# Patient Record
Sex: Male | Born: 1985 | Race: White | Hispanic: No | Marital: Married | Smoking: Never smoker
Health system: Southern US, Community
[De-identification: ages and names within clinical notes are randomized; demographics above are authoritative.]

## PROBLEM LIST (undated history)

## (undated) DIAGNOSIS — J45909 Unspecified asthma, uncomplicated: Secondary | ICD-10-CM

---

## 2011-09-08 ENCOUNTER — Ambulatory Visit (HOSPITAL_COMMUNITY)
Admission: RE | Admit: 2011-09-08 | Discharge: 2011-09-08 | Disposition: A | Discharge status: Home / Self Care | Payer: No Typology Code available for payment source | Source: Ambulatory Visit | Attending: Family Medicine | Admitting: Family Medicine

## 2011-09-08 ENCOUNTER — Other Ambulatory Visit (HOSPITAL_COMMUNITY): Payer: Self-pay | Admitting: Family Medicine

## 2011-09-08 ENCOUNTER — Other Ambulatory Visit (HOSPITAL_COMMUNITY): Payer: Self-pay | Admitting: Internal Medicine

## 2011-09-08 ENCOUNTER — Ambulatory Visit (HOSPITAL_COMMUNITY)
Admission: RE | Admit: 2011-09-08 | Discharge: 2011-09-08 | Disposition: A | Discharge status: Home / Self Care | Payer: No Typology Code available for payment source | Source: Ambulatory Visit | Attending: Internal Medicine | Admitting: Internal Medicine

## 2011-09-08 DIAGNOSIS — Z01818 Encounter for other preprocedural examination: Secondary | ICD-10-CM

## 2011-09-08 DIAGNOSIS — R51 Headache: Secondary | ICD-10-CM | POA: Insufficient documentation

## 2014-09-09 ENCOUNTER — Other Ambulatory Visit (HOSPITAL_COMMUNITY): Payer: Self-pay | Admitting: Physician Assistant

## 2014-09-09 DIAGNOSIS — R109 Unspecified abdominal pain: Secondary | ICD-10-CM

## 2014-09-11 ENCOUNTER — Ambulatory Visit (HOSPITAL_COMMUNITY)
Admission: RE | Admit: 2014-09-11 | Discharge: 2014-09-11 | Disposition: A | Discharge status: Home / Self Care | Payer: 59 | Source: Ambulatory Visit | Attending: Physician Assistant | Admitting: Physician Assistant

## 2014-09-11 DIAGNOSIS — R109 Unspecified abdominal pain: Secondary | ICD-10-CM | POA: Diagnosis not present

## 2016-10-11 DIAGNOSIS — Z23 Encounter for immunization: Secondary | ICD-10-CM | POA: Diagnosis not present

## 2016-11-30 DIAGNOSIS — Z1389 Encounter for screening for other disorder: Secondary | ICD-10-CM | POA: Diagnosis not present

## 2017-05-16 DIAGNOSIS — R1011 Right upper quadrant pain: Secondary | ICD-10-CM | POA: Diagnosis not present

## 2017-05-16 DIAGNOSIS — K829 Disease of gallbladder, unspecified: Secondary | ICD-10-CM | POA: Diagnosis not present

## 2017-05-16 DIAGNOSIS — Z1389 Encounter for screening for other disorder: Secondary | ICD-10-CM | POA: Diagnosis not present

## 2017-05-18 ENCOUNTER — Other Ambulatory Visit (HOSPITAL_COMMUNITY): Payer: Self-pay | Admitting: Family Medicine

## 2017-05-18 DIAGNOSIS — R1011 Right upper quadrant pain: Secondary | ICD-10-CM

## 2017-05-24 ENCOUNTER — Encounter (HOSPITAL_COMMUNITY): Payer: Self-pay

## 2017-05-24 ENCOUNTER — Encounter (HOSPITAL_COMMUNITY)
Admission: RE | Admit: 2017-05-24 | Discharge: 2017-05-24 | Disposition: A | Discharge status: Home / Self Care | Payer: 59 | Source: Ambulatory Visit | Attending: Family Medicine | Admitting: Family Medicine

## 2017-05-24 DIAGNOSIS — R1011 Right upper quadrant pain: Secondary | ICD-10-CM | POA: Diagnosis present

## 2017-05-24 HISTORY — DX: Unspecified asthma, uncomplicated: J45.909

## 2017-05-24 MED ORDER — TECHNETIUM TC 99M MEBROFENIN IV KIT
5.0000 | PACK | Freq: Once | INTRAVENOUS | Status: AC | PRN
  Administered 2017-05-24: 5 via INTRAVENOUS

## 2017-12-18 DIAGNOSIS — J069 Acute upper respiratory infection, unspecified: Secondary | ICD-10-CM | POA: Diagnosis not present

## 2018-05-24 ENCOUNTER — Ambulatory Visit (HOSPITAL_COMMUNITY)
Admission: RE | Admit: 2018-05-24 | Discharge: 2018-05-24 | Disposition: A | Discharge status: Home / Self Care | Payer: 59 | Source: Ambulatory Visit | Attending: Family Medicine | Admitting: Family Medicine

## 2018-05-24 ENCOUNTER — Other Ambulatory Visit (HOSPITAL_COMMUNITY): Payer: Self-pay | Admitting: Family Medicine

## 2018-05-24 DIAGNOSIS — S0011XA Contusion of right eyelid and periocular area, initial encounter: Secondary | ICD-10-CM

## 2018-05-24 DIAGNOSIS — R55 Syncope and collapse: Secondary | ICD-10-CM | POA: Diagnosis not present

## 2018-06-28 DIAGNOSIS — Z23 Encounter for immunization: Secondary | ICD-10-CM | POA: Diagnosis not present

## 2018-06-28 DIAGNOSIS — Z0000 Encounter for general adult medical examination without abnormal findings: Secondary | ICD-10-CM | POA: Diagnosis not present

## 2018-06-28 DIAGNOSIS — Z6838 Body mass index (BMI) 38.0-38.9, adult: Secondary | ICD-10-CM | POA: Diagnosis not present

## 2018-07-23 DIAGNOSIS — N419 Inflammatory disease of prostate, unspecified: Secondary | ICD-10-CM | POA: Diagnosis not present

## 2020-06-07 IMAGING — CT CT HEAD W/O CM
3 series · 15 of 47 positions shown, 18 images · non-contrast
Comparison: Brain MRI September 08, 2011.

CLINICAL DATA: Syncopal episode

EXAM:
CT HEAD WITHOUT CONTRAST
TECHNIQUE: Contiguous axial images were obtained from the base of the skull
through the vertex without intravenous contrast.

[Series 2: head wo · axial · 0.43mm/px · z∈[+28,+158]mm · 9 of 32 slices shown, 12 images]
[im 3/32  brain]
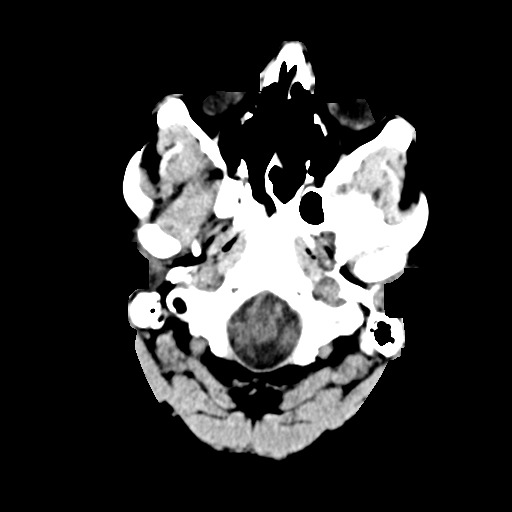
[im 3/32  bone]
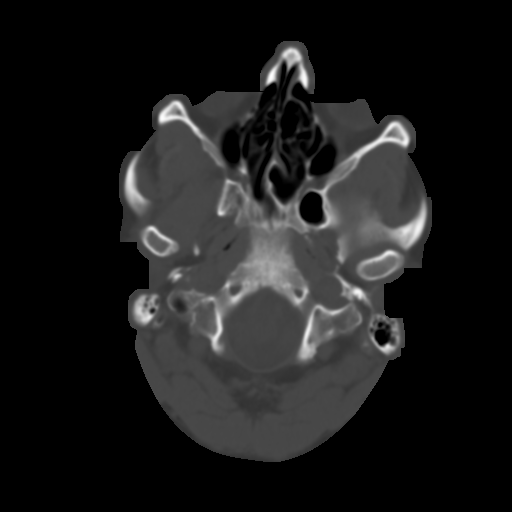
[im 6/32  brain]
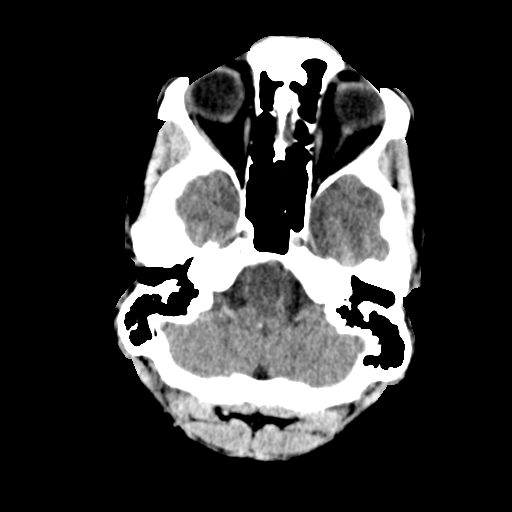
[im 9/32  brain]
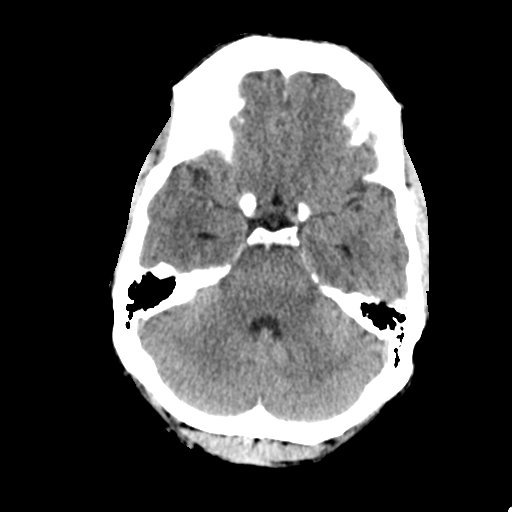
[im 12/32  brain]
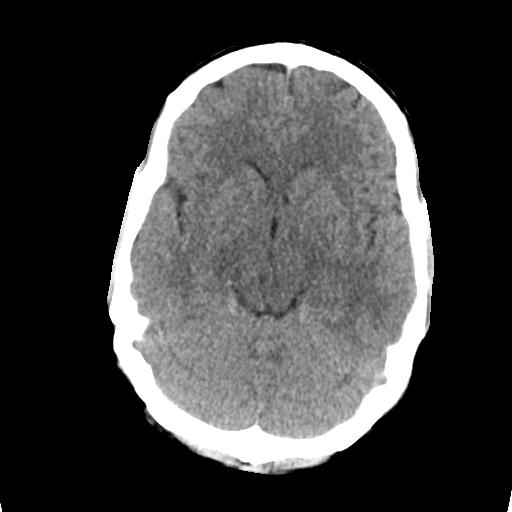
[im 17/32  brain]
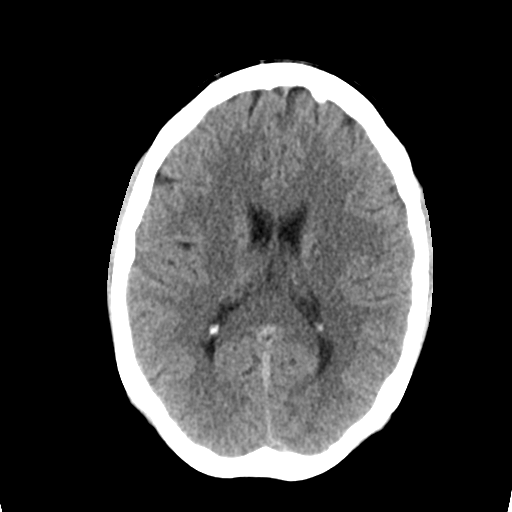
[im 17/32  bone]
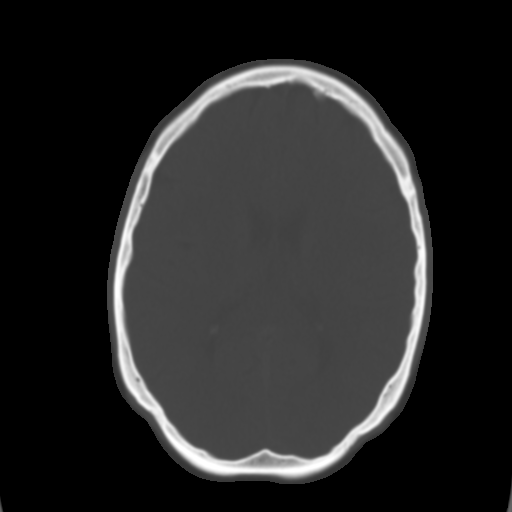
[im 20/32  brain]
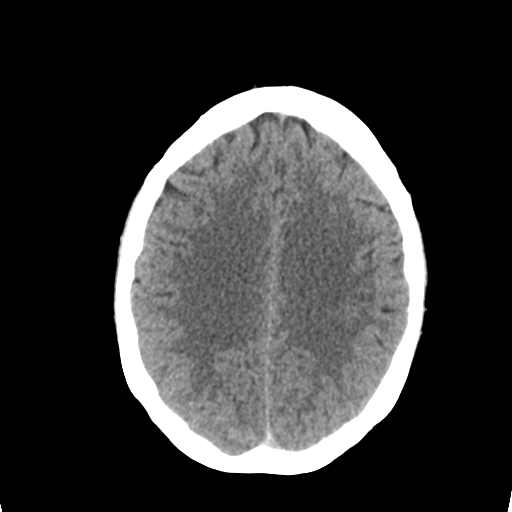
[im 23/32  brain]
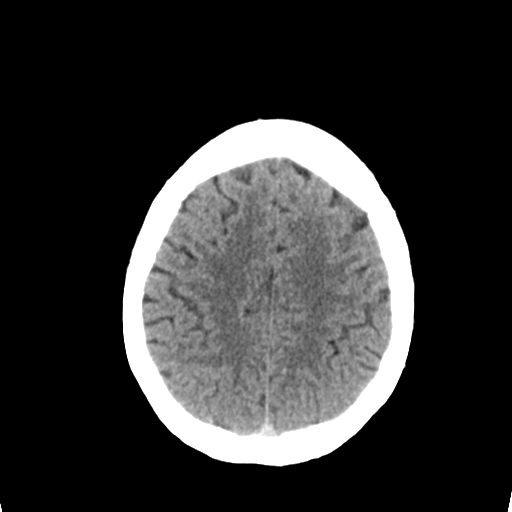
[im 26/32  brain]
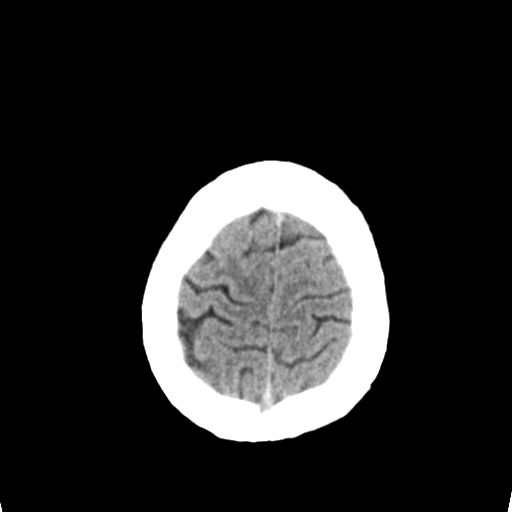
[im 29/32  brain]
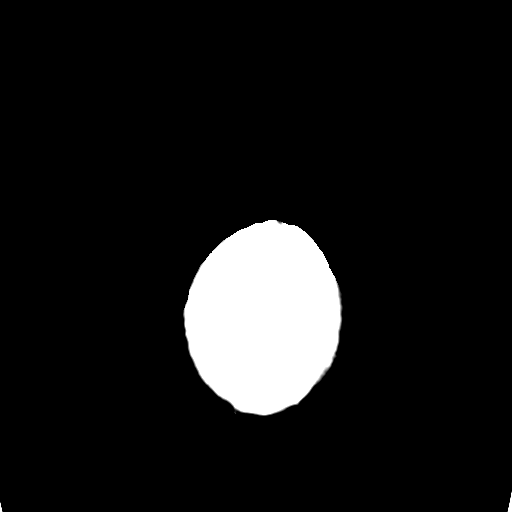
[im 29/32  bone]
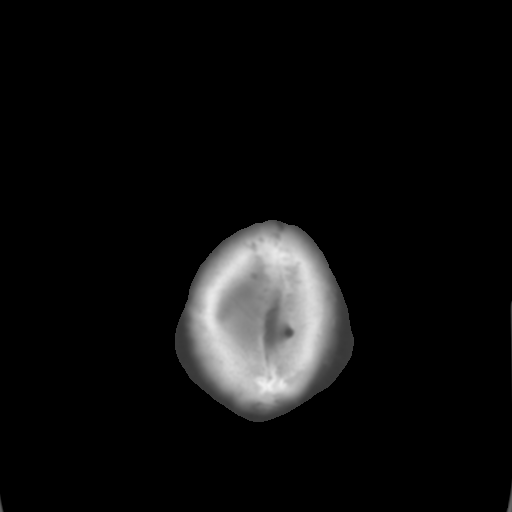

[Series 4: coronal soft tissue · coronal · 0.34mm/px · 3 of 70 slices shown]
[im 24/70  brain]
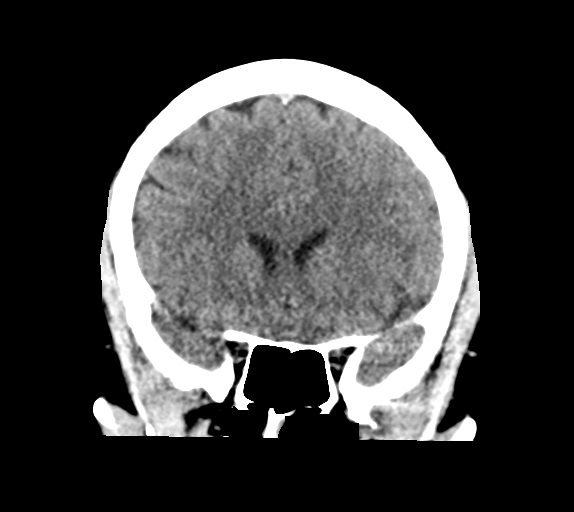
[im 31/70  brain]
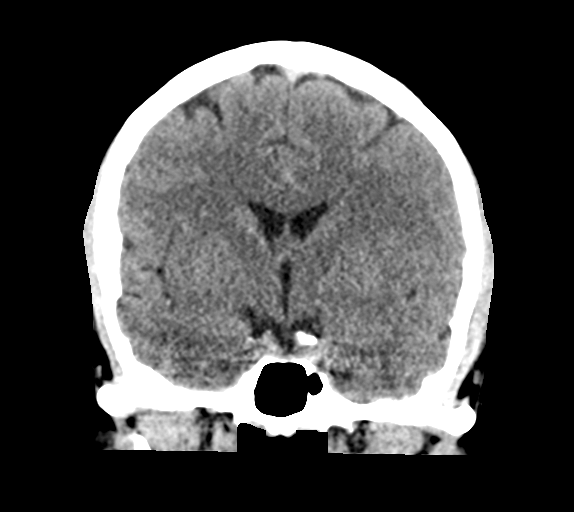
[im 39/70  brain]
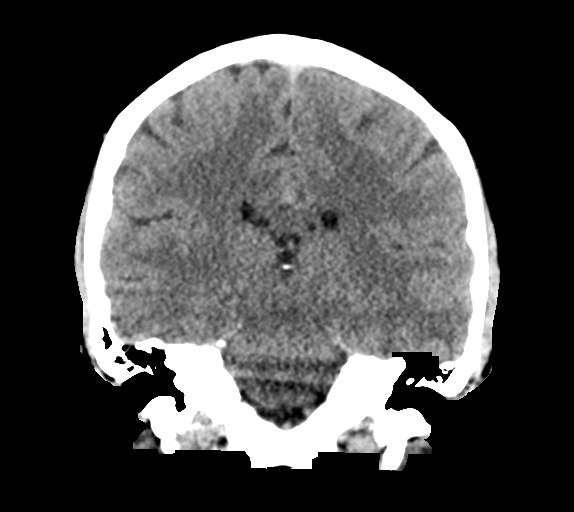

[Series 5: sagittal soft tissue · sagittal · 0.33mm/px · 3 of 60 slices shown]
[im 20/60  brain]
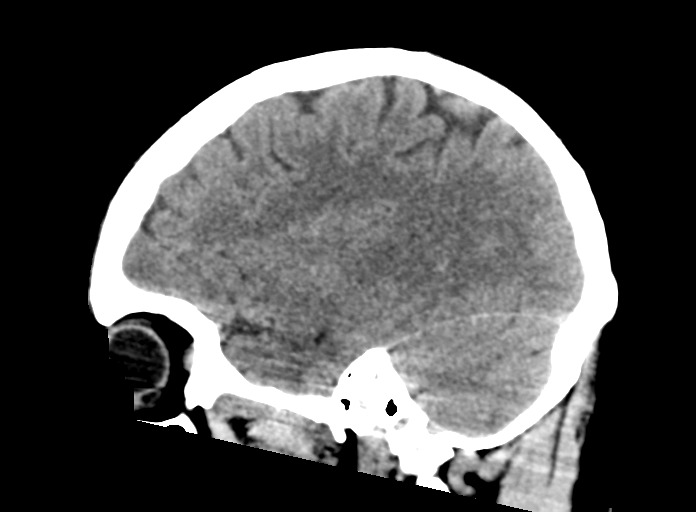
[im 30/60  brain]
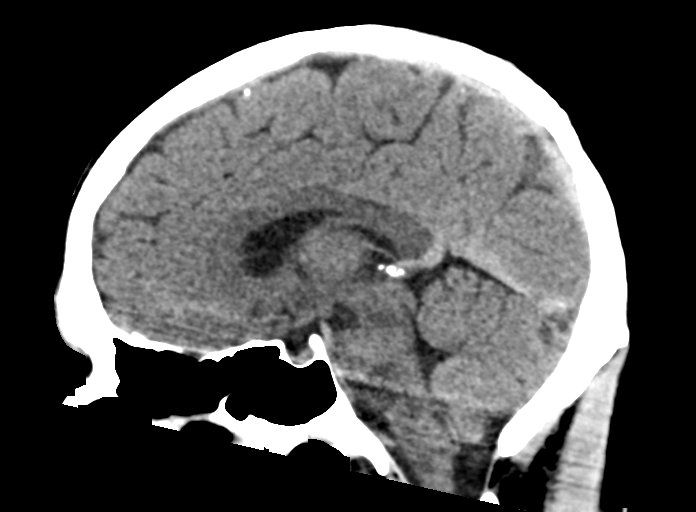
[im 40/60  brain]
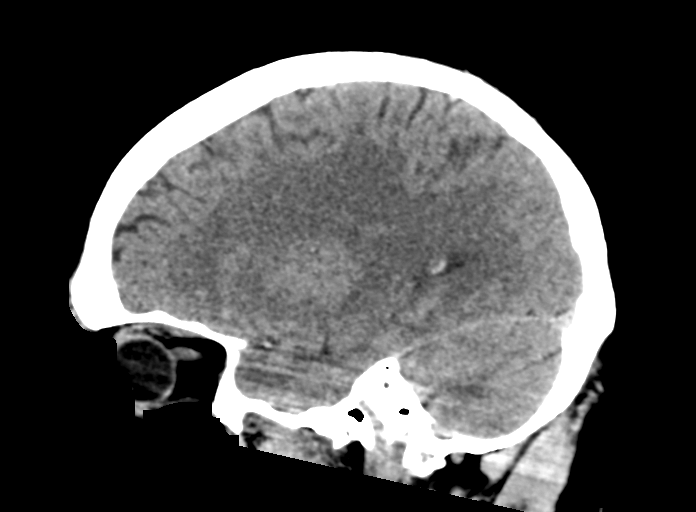

[15 of 47 positions shown; findings below may reference images not displayed]

FINDINGS: Brain: The ventricles are normal in size and configuration. There is
no intracranial mass, hemorrhage, extra-axial fluid collection, or
midline shift. Gray-white compartments appear normal. No evident
acute infarct.

Vascular: No hyperdense vessel. No appreciable vascular
calcification.

Skull: Bony calvarium appears intact.

Sinuses/Orbits: There is mild mucosal thickening in several ethmoid
air cells. Visualized paranasal sinuses elsewhere clear. Orbits
appear symmetric bilaterally.

Other: Mastoid air cells are clear.
IMPRESSION: Mucosal thickening in several ethmoid air cells. Study otherwise
unremarkable.

## 2021-03-24 ENCOUNTER — Other Ambulatory Visit (HOSPITAL_COMMUNITY): Payer: Self-pay | Admitting: Physician Assistant

## 2021-03-24 ENCOUNTER — Ambulatory Visit (HOSPITAL_COMMUNITY)
Admission: RE | Admit: 2021-03-24 | Discharge: 2021-03-24 | Disposition: A | Discharge status: Home / Self Care | Payer: 59 | Source: Ambulatory Visit | Attending: Physician Assistant | Admitting: Physician Assistant

## 2021-03-24 ENCOUNTER — Other Ambulatory Visit: Payer: Self-pay

## 2021-03-24 DIAGNOSIS — M79645 Pain in left finger(s): Secondary | ICD-10-CM

## 2021-10-12 NOTE — Progress Notes (Signed)
[facility] - Clinic Note  10/15/2021     CHIEF COMPLAINT Patient presents for Retina Evaluation   HISTORY OF PRESENT ILLNESS: Sean Knox is a 36 y.o. male who presents to the clinic today for:   HPI     Retina Evaluation   In both eyes.  I, the attending physician,  performed the HPI with the patient and updated documentation appropriately.        Comments   Patient here for Retina Evaluation. Referred by Dr Alberteen Spindle. Patient states vision is good . Dr Alberteen Spindle saw clouds in back of eye. Has been there long time and wanted checked out. No eye pain. Hasn't been to an eye doctor until saw Dr Alberteen Spindle. Only medicine on is Adderaol.      Last edited by Rennis Chris, MD on 10/17/2021 10:22 PM.    Pt is here on the referral of Dr. Aliene Altes for concern of macular scar OS, pt states he saw her for his first eye exam bc he felt like he wasn't seeing as well when he was driving, he states she gave him a rx for glasses that he only wears at night while driving, pt denies any health problems, he takes Adderall for ADHD, pt has no extraordinary birth hx or any trauma to eye, except metal to the eye that was removed by an eye dr, he states he grew up in [place] around cows, but not chickens  Referring physician: Aliene Altes [address] [place], [place]  [address]  HISTORICAL INFORMATION:   Selected notes from the MEDICAL RECORD NUMBER Referred by Dr. 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 (Ophthalmic Drugs)   No current facility-administered medications for this visit. (Ophthalmic Drugs)   No current outpatient medications on file. (Other)   No current facility-administered medications for this visit. (Other)   REVIEW OF SYSTEMS: ROS   Positive for: Eyes Last edited by Laddie Aquas, COA on 10/15/2021  9:35 AM.     ALLERGIES Not on File  PAST MEDICAL  HISTORY Past Medical History:  Diagnosis Date   Asthma    History reviewed. No pertinent surgical history.  FAMILY HISTORY Family History  Problem Relation Age of Onset   Diabetes Maternal Grandmother    SOCIAL HISTORY Social History   Tobacco Use   Smoking status: Never   Smokeless tobacco: Never  Vaping Use   Vaping Use: Never used  Substance Use Topics   Alcohol use: Yes    Comment: social not much at all   Drug use: Never       OPHTHALMIC EXAM:  Base Eye Exam     Visual Acuity (Snellen - Linear)       Right Left   Dist [place] 20/20 20/20         Tonometry (Tonopen, 9:31 AM)       Right Left   Pressure 15 16         Pupils       Dark Light Shape React APD   Right 4 3 Round Brisk None   Left 4 3 Round Brisk None         Visual Fields (Counting fingers)       Left Right    Full Full         Extraocular Movement       Right Left    Full, Ortho Full, Ortho  Neuro/Psych     Oriented x3: Yes   Mood/Affect: Normal         Dilation     Both eyes: 1.0% Mydriacyl, 2.5% Phenylephrine @ 9:31 AM           Slit Lamp and Fundus Exam     Slit Lamp Exam       Right Left   Lids/Lashes Normal Normal   Conjunctiva/Sclera White and quiet White and quiet   Cornea Clear Clear   Anterior Chamber Deep and quiet Deep and quiet   Iris Round and dilated Round and dilated   Lens Clear Clear   Anterior Vitreous Vitreous syneresis Vitreous syneresis         Fundus Exam       Right Left   Disc Pink and Sharp Pink and Sharp   C/D Ratio 0.1 0.1   Macula Flat, Good foveal reflex, mild RPE mottling, No heme or edema Flat, Good foveal reflex, pigmented CR scar nasal to fovea, approx 0.2x0.4DD   Vessels Normal mild attenuation, mild tortuousity   Periphery Attached Attached           Refraction     Manifest Refraction       Sphere Cylinder Dist [place]   Right [place] Sphere 20/20   Left [place] Sphere 20/20           IMAGING  AND PROCEDURES  Imaging and Procedures for 10/15/2021  OCT, Retina - OU - Both Eyes       Right Eye Quality was good. Central Foveal Thickness: 284. Progression has no prior data. Findings include normal foveal contour, no IRF, no SRF, vitreomacular adhesion .   Left Eye Central Foveal Thickness: 287. Progression has no prior data. Findings include normal foveal contour, no IRF, no SRF, subretinal hyper-reflective material, pigment epithelial detachment, vitreomacular adhesion (+PED/SRHM nasal to fovea).   Notes *Images captured and stored on drive  Diagnosis / Impression:  NFP, no IRF/SRF OU OS: focal PED/SRHM nasal to fovea  Clinical management:  See below  Abbreviations: NFP - Normal foveal profile. CME - cystoid macular edema. PED - pigment epithelial detachment. IRF - intraretinal fluid. SRF - subretinal fluid. EZ - ellipsoid zone. ERM - epiretinal membrane. ORA - outer retinal atrophy. ORT - outer retinal tubulation. SRHM - subretinal hyper-reflective material. 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      Return in about 2 months (around 12/13/2021) for f/u CR scar OS, DFE, OCT, FA.  There are no Patient Instructions on file for this visit.   Explained the diagnoses, plan, and follow up with the patient and they expressed understanding.   Patient expressed understanding of the importance of proper follow up care.   This document serves as a  record of services personally performed by Karie Chimera, MD, PhD. It was created on their behalf by Joni Reining, an ophthalmic technician. The creation of this record is the provider's dictation and/or activities during the visit.    Electronically signed by: Joni Reining COA, 10/17/21  10:24 PM  This document serves as a record of services personally performed by Karie Chimera, MD, PhD. It was created on their behalf by Glee Arvin. Manson Passey, OA an ophthalmic technician. The creation of this record is the provider's dictation and/or activities during the visit.    Electronically signed by: Glee Arvin. Manson Passey, [place] 01.13.2023 10:24 PM  Karie Chimera, M.D., Ph.D. Diseases & Surgery of the Retina and Vitreous [facility]  I have reviewed the above documentation for accuracy and completeness, and I agree with the above. Karie Chimera, M.D., Ph.D. 10/17/21 10:29 PM  Abbreviations: M myopia (nearsighted); A astigmatism; H hyperopia (farsighted); P presbyopia; Mrx spectacle prescription;  CTL contact lenses; OD right eye; OS left eye; OU both eyes  XT exotropia; ET esotropia; PEK punctate epithelial keratitis; PEE punctate epithelial erosions; DES dry eye syndrome; MGD meibomian gland dysfunction; ATs artificial tears; PFAT's preservative free artificial tears; NSC nuclear sclerotic cataract; PSC posterior subcapsular cataract; ERM epi-retinal membrane; PVD posterior vitreous detachment; RD retinal detachment; DM diabetes mellitus; DR diabetic retinopathy; NPDR non-proliferative diabetic retinopathy; PDR proliferative diabetic retinopathy; CSME clinically significant macular edema; DME diabetic macular edema; dbh dot blot hemorrhages; CWS cotton wool spot; POAG primary open angle glaucoma; C/D cup-to-disc ratio; HVF humphrey visual field; GVF goldmann visual field; OCT optical  coherence tomography; IOP intraocular pressure; BRVO Branch retinal vein occlusion; CRVO central retinal vein occlusion; CRAO central retinal artery occlusion; BRAO branch retinal artery occlusion; RT retinal tear; SB scleral buckle; PPV pars plana vitrectomy; VH Vitreous hemorrhage; PRP panretinal laser photocoagulation; IVK intravitreal kenalog; VMT vitreomacular traction; MH Macular hole;  NVD neovascularization of the disc; NVE neovascularization elsewhere; AREDS age related eye disease study; ARMD age related macular degeneration; POAG primary open angle glaucoma; EBMD epithelial/anterior basement membrane dystrophy; ACIOL anterior chamber intraocular lens; IOL intraocular lens; PCIOL posterior chamber intraocular lens; Phaco/IOL phacoemulsification with intraocular lens placement; PRK photorefractive keratectomy; LASIK laser assisted in situ keratomileusis; HTN hypertension; DM diabetes mellitus; COPD chronic obstructive pulmonary disease

## 2021-10-15 ENCOUNTER — Ambulatory Visit (INDEPENDENT_AMBULATORY_CARE_PROVIDER_SITE_OTHER): Payer: 59 | Admitting: Ophthalmology

## 2021-10-15 ENCOUNTER — Encounter (INDEPENDENT_AMBULATORY_CARE_PROVIDER_SITE_OTHER): Payer: Self-pay | Admitting: Ophthalmology

## 2021-10-15 ENCOUNTER — Other Ambulatory Visit: Payer: Self-pay

## 2021-10-15 DIAGNOSIS — H31002 Unspecified chorioretinal scars, left eye: Secondary | ICD-10-CM | POA: Diagnosis not present

## 2021-10-15 DIAGNOSIS — H3581 Retinal edema: Secondary | ICD-10-CM

## 2021-10-17 ENCOUNTER — Encounter (INDEPENDENT_AMBULATORY_CARE_PROVIDER_SITE_OTHER): Payer: Self-pay | Admitting: Ophthalmology

## 2021-12-07 NOTE — Progress Notes (Shared)
?[facility] - Clinic Note ? ?12/17/2021 ? ?  ? ?CHIEF COMPLAINT ?Patient presents for No chief complaint on file. ? ? ?HISTORY OF PRESENT ILLNESS: ?Sean Knox is a 36 y.o. male who presents to the clinic today for:  ? ? ?Pt is here on the referral of Dr. Aliene Altes for concern of macular scar OS, pt states he saw her for his first eye exam bc he felt like he wasn't seeing as well when he was driving, he states she gave him a rx for glasses that he only wears at night while driving, pt denies any health problems, he takes Adderall for ADHD, pt has no extraordinary birth hx or any trauma to eye, except metal to the eye that was removed by an eye dr, he states he grew up in [place] around cows, but not chickens ? ?Referring physician: ?Aliene Altes ?[address] ?[place], [place]  [address] ? ?HISTORICAL INFORMATION:  ? ?Selected notes from the MEDICAL RECORD NUMBER ?Referred by Dr. 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  ? ?CURRENT MEDICATIONS: ?No current outpatient medications on file. (Ophthalmic Drugs)  ? ?No current facility-administered medications for this visit. (Ophthalmic Drugs)  ? ?No current outpatient medications on file. (Other)  ? ?No current facility-administered medications for this visit. (Other)  ? ?REVIEW OF SYSTEMS: ? ? ?ALLERGIES ?Not on File ? ?PAST MEDICAL HISTORY ?Past Medical History:  ?Diagnosis Date  ? Asthma   ? ?No past surgical history on file. ? ?FAMILY HISTORY ?Family History  ?Problem Relation Age of Onset  ? Diabetes Maternal Grandmother   ? ?SOCIAL HISTORY ?Social History  ? ?Tobacco Use  ? Smoking status: Never  ? Smokeless tobacco: Never  ?Vaping Use  ? Vaping Use: Never used  ?Substance Use Topics  ? Alcohol use: Yes  ?  Comment: social not much at all  ? Drug use: Never  ?  ? ?  ?OPHTHALMIC EXAM: ? ?Not recorded ?  ? ?IMAGING AND PROCEDURES  ?Imaging and Procedures for 12/17/2021 ? ? ?  ?  ? ?  ?ASSESSMENT/PLAN: ? ?  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  ? ? ?1.Pigmented chorioretinal scar OS ? - exam shows 0.2 x 0.4DD pigmented CR scar nasal to fovea ? - unclear etiology -- ?idiopathic CNV ? - OCT without IRF/SRF ? - pt asymptomatic -- incidental finding on routine exam ? - recommend FA, transit OS -- pt wishes to defer until f/u visit ?- no retinal or ophthalmic interventions indicated or recommended at this time ?- f/u in 2 months, DFE, OCT, FA (transit OS) ? ?Ophthalmic Meds Ordered this visit:  ?No orders of the defined types were placed in this encounter. ? ? ?  ? ?No follow-ups on file. ? ?There are no Patient Instructions on file for this visit. ? ? ?Explained the diagnoses, plan, and follow up with the patient and they expressed understanding.  Patient expressed understanding of the importance of proper follow up care.  ? ?This document serves as a record of services personally performed by Karie Chimera, MD, PhD. It was created on their behalf by Joni Reining, an ophthalmic technician. The creation of this record is the provider's dictation and/or activities during the visit.   ? ?Electronically signed by: Joni Reining COA, 12/07/21  9:13 AM ? ? ?Karie Chimera, M.D., Ph.D. ?Diseases & Surgery of the Retina and Vitreous ?[facility] ? ? ?Abbreviations: ?M myopia (nearsighted); A astigmatism; H hyperopia (farsighted); P presbyopia; Mrx spectacle prescription;  CTL contact lenses; OD right eye; OS left eye; OU both eyes  XT exotropia; ET esotropia; PEK punctate epithelial keratitis; PEE punctate epithelial erosions; DES dry eye syndrome; MGD meibomian gland dysfunction; ATs artificial tears; PFAT's preservative free artificial tears; NSC nuclear sclerotic cataract; PSC posterior subcapsular cataract; ERM epi-retinal membrane; PVD posterior vitreous detachment; RD retinal detachment; DM diabetes mellitus; DR diabetic retinopathy; NPDR non-proliferative diabetic retinopathy; PDR  proliferative diabetic retinopathy; CSME clinically significant macular edema; DME diabetic macular edema; dbh dot blot hemorrhages; CWS cotton wool spot; POAG primary open angle glaucoma; C/D cup-to-disc ratio; HVF humphrey visual field; GVF goldmann visual field; OCT optical coherence tomography; IOP intraocular pressure; BRVO Branch retinal vein occlusion; CRVO central retinal vein occlusion; CRAO central retinal artery occlusion; BRAO branch retinal artery occlusion; RT retinal tear; SB scleral buckle; PPV pars plana vitrectomy; VH Vitreous hemorrhage; PRP panretinal laser photocoagulation; IVK intravitreal kenalog; VMT vitreomacular traction; MH Macular hole;  NVD neovascularization of the disc; NVE neovascularization elsewhere; AREDS age related eye disease study; ARMD age related macular degeneration; POAG primary open angle glaucoma; EBMD epithelial/anterior basement membrane dystrophy; ACIOL anterior chamber intraocular lens; IOL intraocular lens; PCIOL posterior chamber intraocular lens; Phaco/IOL phacoemulsification with intraocular lens placement; PRK photorefractive keratectomy; LASIK laser assisted in situ keratomileusis; HTN hypertension; DM diabetes mellitus; COPD chronic obstructive pulmonary disease  ?

## 2021-12-17 ENCOUNTER — Encounter (INDEPENDENT_AMBULATORY_CARE_PROVIDER_SITE_OTHER): Payer: 59 | Admitting: Ophthalmology

## 2021-12-17 NOTE — Progress Notes (Signed)
?[facility] - Clinic Note ? ?12/24/2021 ? ?  ? ?CHIEF COMPLAINT ?Patient presents for Retina Follow Up ? ? ?HISTORY OF PRESENT ILLNESS: ?Roxy Filler is a 36 y.o. male who presents to the clinic today for:  ? ?HPI   ? ? Retina Follow Up   ?Patient presents with  Other.  In left eye.  Duration of 10 weeks.  Since onset it is stable.  I, the attending physician,  performed the HPI with the patient and updated documentation appropriately. ? ?  ?  ? ? Comments   ?10 week follow up pigmented CR scar OS- Doing well, no new problems.  ? ?  ?  ?Last edited by Rennis Chris, MD on 12/24/2021  1:30 PM.  ?  ? ? ?Referring physician: ?Aliene Altes ?[address] ?[place], [place]  [address] ? ?HISTORICAL INFORMATION:  ? ?Selected notes from the MEDICAL RECORD NUMBER ?Referred by Dr. 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  ? ?CURRENT MEDICATIONS: ?No current outpatient medications on file. (Ophthalmic Drugs)  ? ?No current facility-administered medications for this visit. (Ophthalmic Drugs)  ? ?Current Outpatient Medications (Other)  ?Medication Sig  ? amphetamine-dextroamphetamine (ADDERALL XR) 15 MG 24 hr capsule Take 15 mg by mouth every morning.  ? ?No current facility-administered medications for this visit. (Other)  ? ?REVIEW OF SYSTEMS: ?ROS   ?Positive for: Eyes ?Negative for: Constitutional, Gastrointestinal, Neurological, Skin, Genitourinary, Musculoskeletal, HENT, Endocrine, Cardiovascular, Respiratory, Psychiatric, Allergic/Imm, Heme/Lymph ?Last edited by Joni Reining, COA on 12/24/2021  9:32 AM.  ?  ? ? ?ALLERGIES ?Not on File ? ?PAST MEDICAL HISTORY ?Past Medical History:  ?Diagnosis Date  ? Asthma   ? ?History reviewed. No pertinent surgical history. ? ?FAMILY HISTORY ?Family History  ?Problem Relation Age of Onset  ? Diabetes Maternal Grandmother   ? ?SOCIAL HISTORY ?Social History  ? ?Tobacco Use  ? Smoking status: Never  ? Smokeless tobacco: Never  ?Vaping Use  ?  Vaping Use: Never used  ?Substance Use Topics  ? Alcohol use: Yes  ?  Comment: social not much at all  ? Drug use: Never  ?  ? ?  ?OPHTHALMIC EXAM: ? ?Base Eye Exam   ? ? Visual Acuity (Snellen - Linear)   ? ?   Right Left  ? Dist [place] 20/20 20/20  ? ?  ?  ? ? Tonometry (Tonopen, 9:37 AM)   ? ?   Right Left  ? Pressure 15 17  ? ?  ?  ? ? Pupils   ? ?   Dark Light Shape React APD  ? Right 4 3 Round Brisk None  ? Left 4 3 Round Brisk None  ? ?  ?  ? ? Visual Fields (Counting fingers)   ? ?   Left Right  ?  Full Full  ? ?  ?  ? ? Extraocular Movement   ? ?   Right Left  ?  Full Full  ? ?  ?  ? ? Neuro/Psych   ? ? Oriented x3: Yes  ? Mood/Affect: Normal  ? ?  ?  ? ? Dilation   ? ? Both eyes: 1.0% Mydriacyl, 2.5% Phenylephrine @ 9:37 AM  ? ?  ?  ? ?  ? ?Slit Lamp and Fundus Exam   ? ? Slit Lamp Exam   ? ?   Right Left  ? Lids/Lashes Normal Normal  ? Conjunctiva/Sclera White and quiet White and quiet  ?  Cornea Clear Clear  ? Anterior Chamber Deep and quiet Deep and quiet  ? Iris Round and dilated Round and dilated  ? Lens Clear Clear  ? Anterior Vitreous Vitreous syneresis Vitreous syneresis  ? ?  ?  ? ? Fundus Exam   ? ?   Right Left  ? Disc Pink and Sharp Pink and Sharp  ? C/D Ratio 0.1 0.1  ? Macula Flat, Good foveal reflex, mild RPE mottling, No heme or edema Flat, Good foveal reflex, pigmented CR scar nasal to fovea, approx 0.2x0.4DD -- stable from prior  ? Vessels Normal mild attenuation, mild tortuousity  ? Periphery Attached Attached  ? ?  ?  ? ?  ? ?IMAGING AND PROCEDURES  ?Imaging and Procedures for 12/24/2021 ? ?OCT, Retina - OU - Both Eyes   ? ?   ?Right Eye ?Quality was good. Central Foveal Thickness: 281. Progression has been stable. Findings include normal foveal contour, no IRF, no SRF, vitreomacular adhesion .  ? ?Left Eye ?Quality was good. Central Foveal Thickness: 284. Progression has been stable. Findings include normal foveal contour, no IRF, no SRF, subretinal hyper-reflective material, pigment  epithelial detachment, vitreomacular adhesion (+PED/SRHM nasal to fovea -- unchanged from prior ).  ? ?Notes ?*Images captured and stored on drive ? ?Diagnosis / Impression:  ?NFP, no IRF/SRF OU ?OS: focal PED/SRHM nasal to fovea -- no change from prior ? ?Clinical management:  ?See below ? ?Abbreviations: NFP - Normal foveal profile. CME - cystoid macular edema. PED - pigment epithelial detachment. IRF - intraretinal fluid. SRF - subretinal fluid. EZ - ellipsoid zone. ERM - epiretinal membrane. ORA - outer retinal atrophy. ORT - outer retinal tubulation. SRHM - subretinal hyper-reflective material. IRHM - intraretinal hyper-reflective material ? ? ?  ? ?Fluorescein Angiography Optos (Transit OS)   ? ?   ?Right Eye ?Progression has no prior data. Early phase findings include normal observations. Mid/Late phase findings include normal observations.  ? ?Left Eye ?Progression has no prior data. Early phase findings include blockage. Mid/Late phase findings include blockage, staining (Faint late focal staining nasal to fovea -- corresponding to CR scar).  ? ?Notes ?**Images stored on drive** ? ?Impression: ?OD: Normal study  ?OS:  Focal early blockage and late focal staining of CR scar nasal to fovea ? ?  ? ?  ?  ? ?  ?ASSESSMENT/PLAN: ? ?  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  Fluorescein Angiography Optos (Transit OS)  ?  ? ? ?1.Pigmented chorioretinal scar OS ? - exam shows 0.2 x 0.4DD pigmented CR scar nasal to fovea -- stable from prior  ? 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 - FA 03.24.23 shows OS focal early blockage and late focal staining of CR scan nasal to fovea -- no active leakage ? - OCT without IRF/SRF ? - pt asymptomatic -- incidental finding on routine exam ? - recommend observation ?- no retinal or ophthalmic interventions indicated or recommended at this time  ?- f/u in 1 year, DFE, OCT ? ?Ophthalmic Meds Ordered this visit:  ?No  orders of the defined types were placed in this encounter. ? ? ?  ? ?Return in about 1 year (around 12/25/2022) for DFE, OCT. ? ?There are no Patient Instructions on file for this visit. ? ? ?Explained the diagnoses, plan, and follow up with the patient and they expressed understanding.  Patient expressed understanding of the importance  of proper follow up care.  ? ?This document serves as a record of services personally performed by Karie ChimeraBrian G. Sharonlee Nine, MD, PhD. It was created on their behalf by Joni Reiningobin Hodges, an ophthalmic technician. The creation of this record is the provider's dictation and/or activities during the visit.   ? ?Electronically signed by: Joni Reiningobin Hodges COA, 12/24/21  1:31 PM ? ?Karie ChimeraBrian G. Antawn Sison, M.D., Ph.D. ?Diseases & Surgery of the Retina and Vitreous ?[facility] ? ?I have reviewed the above documentation for accuracy and completeness, and I agree with the above. Karie ChimeraBrian G. Colum Colt, M.D., Ph.D. 12/24/21 1:32 PM ? ? ?Abbreviations: ?M myopia (nearsighted); A astigmatism; H hyperopia (farsighted); P presbyopia; Mrx spectacle prescription;  CTL contact lenses; OD right eye; OS left eye; OU both eyes  XT exotropia; ET esotropia; PEK punctate epithelial keratitis; PEE punctate epithelial erosions; DES dry eye syndrome; MGD meibomian gland dysfunction; ATs artificial tears; PFAT's preservative free artificial tears; NSC nuclear sclerotic cataract; PSC posterior subcapsular cataract; ERM epi-retinal membrane; PVD posterior vitreous detachment; RD retinal detachment; DM diabetes mellitus; DR diabetic retinopathy; NPDR non-proliferative diabetic retinopathy; PDR proliferative diabetic retinopathy; CSME clinically significant macular edema; DME diabetic macular edema; dbh dot blot hemorrhages; CWS cotton wool spot; POAG primary open angle glaucoma; C/D cup-to-disc ratio; HVF humphrey visual field; GVF goldmann visual field; OCT optical coherence tomography; IOP intraocular pressure; BRVO  Branch retinal vein occlusion; CRVO central retinal vein occlusion; CRAO central retinal artery occlusion; BRAO branch retinal artery occlusion; RT retinal tear; SB scleral buckle; PPV pars plana vitrectomy; VH Vitreous

## 2021-12-24 ENCOUNTER — Other Ambulatory Visit: Payer: Self-pay

## 2021-12-24 ENCOUNTER — Encounter (INDEPENDENT_AMBULATORY_CARE_PROVIDER_SITE_OTHER): Payer: Self-pay | Admitting: Ophthalmology

## 2021-12-24 ENCOUNTER — Ambulatory Visit (INDEPENDENT_AMBULATORY_CARE_PROVIDER_SITE_OTHER): Payer: 59 | Admitting: Ophthalmology

## 2021-12-24 DIAGNOSIS — H31002 Unspecified chorioretinal scars, left eye: Secondary | ICD-10-CM | POA: Diagnosis not present

## 2022-02-10 ENCOUNTER — Ambulatory Visit
Admission: EM | Admit: 2022-02-10 | Discharge: 2022-02-10 | Disposition: A | Discharge status: Home / Self Care | Payer: 59 | Attending: Family Medicine | Admitting: Family Medicine

## 2022-02-10 DIAGNOSIS — J4521 Mild intermittent asthma with (acute) exacerbation: Secondary | ICD-10-CM | POA: Diagnosis not present

## 2022-02-10 DIAGNOSIS — J069 Acute upper respiratory infection, unspecified: Secondary | ICD-10-CM | POA: Diagnosis present

## 2022-02-10 LAB — POCT RAPID STREP A (OFFICE): Rapid Strep A Screen: NEGATIVE

## 2022-02-10 MED ORDER — DEXAMETHASONE SODIUM PHOSPHATE 10 MG/ML IJ SOLN
10.0000 mg | Freq: Once | INTRAMUSCULAR | Status: AC
  Administered 2022-02-10: 10 mg via INTRAMUSCULAR

## 2022-02-10 MED ORDER — ALBUTEROL SULFATE HFA 108 (90 BASE) MCG/ACT IN AERS: 1.0000 | INHALATION_SPRAY | Freq: Four times a day (QID) | RESPIRATORY_TRACT | 2 refills | Status: DC | PRN

## 2022-02-10 MED ORDER — LIDOCAINE VISCOUS HCL 2 % MT SOLN: 10.0000 mL | OROMUCOSAL | 0 refills | Status: AC | PRN

## 2022-02-10 NOTE — ED Provider Notes (Addendum)
?[place] ? ? ? ?CSN: [id number] ?Arrival date & time: 02/10/22  [id number] ? ? ?  ? ?History   ?Chief Complaint ?Chief Complaint  ?Patient presents with  ? Sinusitis  ?  Possible sinus infection  ? ? ?HPI ?Sean Knox is a 36 y.o. male.  ? ?Presenting today with 4-day history of sore throat with white spots, bilateral ear pain, nasal congestion, headache, dry cough, chest tightness.  Denies fever, chills, body aches, chest pain, shortness of breath, abdominal pain, nausea vomiting or diarrhea.  Taking Advil with mild temporary relief of symptoms.  Multiple household members sick with similar symptoms.  History of asthma does not currently have an albuterol inhaler. ? ?Past Medical History:  ?Diagnosis Date  ? Asthma   ? ? ?There are no problems to display for this patient. ? ? ?History reviewed. No pertinent surgical history. ? ? ?Home Medications   ? ?Prior to Admission medications   ?Medication Sig Start Date End Date Taking? Authorizing Provider  ?albuterol (VENTOLIN HFA) 108 (90 Base) MCG/ACT inhaler Inhale 1-2 puffs into the lungs every 6 (six) hours as needed for wheezing or shortness of breath. 02/10/22  Yes Volney American, PA-C  ?lidocaine (XYLOCAINE) 2 % solution Use as directed 10 mLs in the mouth or throat every 3 (three) hours as needed for mouth pain. 02/10/22  Yes Volney American, PA-C  ?amphetamine-dextroamphetamine (ADDERALL XR) 15 MG 24 hr capsule Take 15 mg by mouth every morning.    [provider]  ? ? ?Family History ?Family History  ?Problem Relation Age of Onset  ? Diabetes Maternal Grandmother   ? ?Social History ?Social History  ? ?Tobacco Use  ? Smoking status: Never  ?  Passive exposure: Never  ? Smokeless tobacco: Never  ?Vaping Use  ? Vaping Use: Never used  ?Substance Use Topics  ? Alcohol use: Yes  ?  Comment: social not much at all  ? Drug use: Never  ? ? ? ?Allergies   ?Patient has no known allergies. ? ? ?Review of Systems ?Review of Systems ?Per  HPI ? ?Physical Exam ?Triage Vital Signs ?ED Triage Vitals  ?Enc Vitals Group  ?   BP 02/10/22 1023 115/70  ?   Pulse Rate 02/10/22 1023 80  ?   Resp 02/10/22 1023 20  ?   Temp 02/10/22 1023 98 ?F (36.7 ?C)  ?   Temp Source 02/10/22 1023 Oral  ?   SpO2 02/10/22 1023 98 %  ?   Weight --   ?   Height --   ?   Head Circumference --   ?   Peak Flow --   ?   Pain Score 02/10/22 1026 8  ?   Pain Loc --   ?   Pain Edu? --   ?   Excl. in [place]? --   ? ?No data found. ? ?Updated Vital Signs ?BP 115/70 (BP Location: Right Arm)   Pulse 80   Temp 98 ?F (36.7 ?C) (Oral)   Resp 20   SpO2 98%  ? ?Visual Acuity ?Right Eye Distance:   ?Left Eye Distance:   ?Bilateral Distance:   ? ?Right Eye Near:   ?Left Eye Near:    ?Bilateral Near:    ? ?Physical Exam ?Vitals and nursing note reviewed.  ?Constitutional:   ?   Appearance: He is well-developed.  ?HENT:  ?   Head: Atraumatic.  ?   Right Ear: External ear normal.  ?  Left Ear: External ear normal.  ?   Nose: Rhinorrhea present.  ?   Mouth/Throat:  ?   Pharynx: Posterior oropharyngeal erythema present. No oropharyngeal exudate.  ?   Comments: Mild tonsillar erythema, edema bilaterally, scant exudates uvula midline, oral airway patent ?Eyes:  ?   Conjunctiva/sclera: Conjunctivae normal.  ?   Pupils: Pupils are equal, round, and reactive to light.  ?Cardiovascular:  ?   Rate and Rhythm: Normal rate and regular rhythm.  ?   Heart sounds: Normal heart sounds.  ?Pulmonary:  ?   Effort: Pulmonary effort is normal. No respiratory distress.  ?   Breath sounds: No wheezing or rales.  ?Musculoskeletal:     ?   General: Normal range of motion.  ?   Cervical back: Normal range of motion and neck supple.  ?Lymphadenopathy:  ?   Cervical: No cervical adenopathy.  ?Skin: ?   General: Skin is warm and dry.  ?Neurological:  ?   Mental Status: He is alert and oriented to person, place, and time.  ?Psychiatric:     ?   Behavior: Behavior normal.  ? ? ? ?UC Treatments / Results  ?Labs ?(all labs  ordered are listed, but only abnormal results are displayed) ?Labs Reviewed  ?CULTURE, GROUP A STREP [facility])  ?POCT RAPID STREP A (OFFICE)  ? ? ?EKG ? ? ?Radiology ?No results found. ? ?Procedures ?Procedures (including critical care time) ? ?Medications Ordered in UC ?Medications  ?dexamethasone (DECADRON) injection 10 mg (has no administration in time range)  ? ? ?Initial Impression / Assessment and Plan / UC Course  ?I have reviewed the triage vital signs and the nursing notes. ? ?Pertinent labs & imaging results that were available during my care of the patient were reviewed by me and considered in my medical decision making (see chart for details). ? ?  ? ?Rapid strep negative, throat culture pending.  Suspect viral illness, Declines viral testing today.  Treat with IM Decadron given history of asthma and worsening chest tightness, albuterol inhaler, viscous lidocaine and discussed supportive over-the-counter medications and home care. ? ?Final Clinical Impressions(s) / UC Diagnoses  ? ?Final diagnoses:  ?Viral URI with cough  ?Mild intermittent asthma with acute exacerbation  ? ?Discharge Instructions   ?None ?  ? ?ED Prescriptions   ? ? Medication Sig Dispense Auth. Provider  ? lidocaine (XYLOCAINE) 2 % solution Use as directed 10 mLs in the mouth or throat every 3 (three) hours as needed for mouth pain. 100 mL Volney American, [place]  ? albuterol (VENTOLIN HFA) 108 (90 Base) MCG/ACT inhaler Inhale 1-2 puffs into the lungs every 6 (six) hours as needed for wheezing or shortness of breath. 18 g Volney American, [place]  ? ?  ? ?PDMP not reviewed this encounter. ?  ?Volney American, PA-C ?02/10/22 1056 ? ?  ?Volney American, [place] ?02/10/22 1057 ? ?

## 2022-02-10 NOTE — ED Triage Notes (Signed)
Pt states his son got something from school and now his throat has white spots on it, both ears are hurting and a headache since Monday ? ?Pt states he took the Advil for pain and it helped everything but his ears ? ?Denies Fever ?

## 2022-02-13 LAB — CULTURE, GROUP A STREP (THRC)

## 2022-06-27 ENCOUNTER — Ambulatory Visit
Admission: RE | Admit: 2022-06-27 | Discharge: 2022-06-27 | Disposition: A | Discharge status: Home / Self Care | Payer: 59 | Source: Ambulatory Visit | Attending: Nurse Practitioner | Admitting: Nurse Practitioner

## 2022-06-27 VITALS — BP 132/88 | HR 77 | Temp 97.9°F | Resp 20

## 2022-06-27 DIAGNOSIS — J069 Acute upper respiratory infection, unspecified: Secondary | ICD-10-CM | POA: Diagnosis not present

## 2022-06-27 DIAGNOSIS — H66001 Acute suppurative otitis media without spontaneous rupture of ear drum, right ear: Secondary | ICD-10-CM

## 2022-06-27 DIAGNOSIS — Z8709 Personal history of other diseases of the respiratory system: Secondary | ICD-10-CM

## 2022-06-27 MED ORDER — AMOXICILLIN 875 MG PO TABS: 875.0000 mg | ORAL_TABLET | Freq: Two times a day (BID) | ORAL | 0 refills | Status: AC

## 2022-06-27 MED ORDER — ALBUTEROL SULFATE HFA 108 (90 BASE) MCG/ACT IN AERS: 1.0000 | INHALATION_SPRAY | Freq: Four times a day (QID) | RESPIRATORY_TRACT | 0 refills | Status: AC | PRN

## 2022-06-27 NOTE — ED Triage Notes (Signed)
Pt presents with nasal congestion  and has using inhaler more than usual and has increased fatigue with ear ache

## 2022-06-27 NOTE — ED Provider Notes (Signed)
[facility]    CSN: [id number] Arrival date & time: 06/27/22  0849      History   Chief Complaint Chief Complaint  Patient presents with   Nasal Congestion    Chest congestion,ear pain,neck pain - Entered by patient    HPI Sean Knox is a 36 y.o. male.   Patient presents with 4 days of chills, sweating, slight productive cough, shortness of breath, wheezing, chest and nasal congestion, runny nose, sneezing, sore throat, sinus pressure, headache and neck pain, ear pain, and fatigue.  He denies fever, chest pain or tightness, abdominal pain, nausea/vomiting, diarrhea, decreased appetite, new rash, and known sick contacts.  Reports he takes allergy medication daily including oral antihistamine and nasal steroid.  Has also been using his albuterol inhaler more frequently than normal for him to help open up his lungs.  Has also been using Afrin, Mucinex, and saline rinses which have provided benefit.  Patient denies antibiotic use in the past 90 days.  Patient endorses a history of asthma for which she takes albuterol as needed.  He is requesting a refill of it today.     Past Medical History:  Diagnosis Date   Asthma     There are no problems to display for this patient.   History reviewed. No pertinent surgical history.     Home Medications    Prior to Admission medications   Medication Sig Start Date End Date Taking? Authorizing Provider  amoxicillin (AMOXIL) 875 MG tablet Take 1 tablet (875 mg total) by mouth 2 (two) times daily for 5 days. 06/27/22 07/02/22 Yes Valentino Nose, NP  albuterol (VENTOLIN HFA) 108 (90 Base) MCG/ACT inhaler Inhale 1-2 puffs into the lungs every 6 (six) hours as needed for wheezing or shortness of breath. 06/27/22   Valentino Nose, NP  amphetamine-dextroamphetamine (ADDERALL XR) 15 MG 24 hr capsule Take 15 mg by mouth every morning.    [provider]  lidocaine (XYLOCAINE) 2 % solution Use as directed 10 mLs  in the mouth or throat every 3 (three) hours as needed for mouth pain. 02/10/22   Particia Nearing, PA-C    Family History Family History  Problem Relation Age of Onset   Diabetes Maternal Grandmother     Social History Social History   Tobacco Use   Smoking status: Never    Passive exposure: Never   Smokeless tobacco: Never  Vaping Use   Vaping Use: Never used  Substance Use Topics   Alcohol use: Yes    Comment: social not much at all   Drug use: Never     Allergies   Patient has no known allergies.   Review of Systems Review of Systems Per HPI  Physical Exam Triage Vital Signs ED Triage Vitals  Enc Vitals Group     BP 06/27/22 0916 132/88     Pulse Rate 06/27/22 0916 77     Resp 06/27/22 0916 20     Temp 06/27/22 0916 97.9 F (36.6 C)     Temp src --      SpO2 06/27/22 0916 93 %     Weight --      Height --      Head Circumference --      Peak Flow --      Pain Score 06/27/22 0914 0     Pain Loc --      Pain Edu? --      Excl. in [place]? --  No data found.  Updated Vital Signs BP 132/88   Pulse 77   Temp 97.9 F (36.6 C)   Resp 20   SpO2 98%   Visual Acuity Right Eye Distance:   Left Eye Distance:   Bilateral Distance:    Right Eye Near:   Left Eye Near:    Bilateral Near:     Physical Exam Vitals and nursing note reviewed.  Constitutional:      General: He is not in acute distress.    Appearance: Normal appearance. He is not ill-appearing or toxic-appearing.  HENT:     Head: Normocephalic and atraumatic.     Right Ear: Ear canal and external ear normal. A middle ear effusion is present. Tympanic membrane is erythematous and bulging.     Left Ear: Tympanic membrane, ear canal and external ear normal.     Nose: Congestion and rhinorrhea present.     Mouth/Throat:     Mouth: Mucous membranes are moist.     Pharynx: Oropharynx is clear. Posterior oropharyngeal erythema present. No oropharyngeal exudate.  Eyes:     General: No  scleral icterus.    Extraocular Movements: Extraocular movements intact.  Cardiovascular:     Rate and Rhythm: Normal rate and regular rhythm.  Pulmonary:     Effort: Pulmonary effort is normal. No respiratory distress.     Breath sounds: Normal breath sounds. No wheezing, rhonchi or rales.  Abdominal:     General: Abdomen is flat. Bowel sounds are normal. There is no distension.     Palpations: Abdomen is soft.     Tenderness: There is no abdominal tenderness.  Musculoskeletal:     Cervical back: Normal range of motion and neck supple.  Lymphadenopathy:     Cervical: No cervical adenopathy.  Skin:    General: Skin is warm and dry.     Capillary Refill: Capillary refill takes less than 2 seconds.     Coloration: Skin is not jaundiced or pale.     Findings: No erythema or rash.  Neurological:     Mental Status: He is alert and oriented to person, place, and time.  Psychiatric:        Behavior: Behavior is cooperative.      UC Treatments / Results  Labs (all labs ordered are listed, but only abnormal results are displayed) Labs Reviewed - No data to display  EKG   Radiology No results found.  Procedures Procedures (including critical care time)  Medications Ordered in UC Medications - No data to display  Initial Impression / Assessment and Plan / UC Course  I have reviewed the triage vital signs and the nursing notes.  Pertinent labs & imaging results that were available during my care of the patient were reviewed by me and considered in my medical decision making (see chart for details).    Patient is well-appearing, normotensive, afebrile, not tachycardic, not tachypneic, oxygenating well on room air.  On examination, he has an ear infection of the right ear.  Treat with amoxicillin twice daily for 5 days.  Continue supportive care for upper respiratory symptoms.  ER and return precautions discussed.  Note given for work.  The patient was given the opportunity to  ask questions.  All questions answered to their satisfaction.  The patient is in agreement to this plan.    Final Clinical Impressions(s) / UC Diagnoses   Final diagnoses:  Non-recurrent acute suppurative otitis media of right ear without spontaneous rupture of tympanic membrane  Acute upper respiratory infection     Discharge Instructions      You have an ear infection in your right ear.  Please take the antibiotic as prescribed.    Your other symptoms are consistent with an upper respiratory infection.  Please continue to use your albuterol inhaler every 4-6 hours as you need it.  Make sure you are taking deep breaths to help fully expand your lungs.   Some things that can make you feel better are: - Increased rest - Increasing fluid with water/sugar free electrolytes - Acetaminophen and ibuprofen as needed for fever/pain.  - Salt water gargling, chloraseptic spray and throat lozenges - OTC guaifenesin (Mucinex).  - Saline sinus flushes or a neti pot.  - Humidifying the air.      ED Prescriptions     Medication Sig Dispense Auth. Provider   albuterol (VENTOLIN HFA) 108 (90 Base) MCG/ACT inhaler Inhale 1-2 puffs into the lungs every 6 (six) hours as needed for wheezing or shortness of breath. 18 g Cathlean Marseilles A, NP   amoxicillin (AMOXIL) 875 MG tablet Take 1 tablet (875 mg total) by mouth 2 (two) times daily for 5 days. 10 tablet Valentino Nose, NP      PDMP not reviewed this encounter.   Valentino Nose, NP 06/27/22 1004

## 2022-06-27 NOTE — Discharge Instructions (Addendum)
You have an ear infection in your right ear.  Please take the antibiotic as prescribed.    Your other symptoms are consistent with an upper respiratory infection.  Please continue to use your albuterol inhaler every 4-6 hours as you need it.  Make sure you are taking deep breaths to help fully expand your lungs.   Some things that can make you feel better are: - Increased rest - Increasing fluid with water/sugar free electrolytes - Acetaminophen and ibuprofen as needed for fever/pain.  - Salt water gargling, chloraseptic spray and throat lozenges - OTC guaifenesin (Mucinex).  - Saline sinus flushes or a neti pot.  - Humidifying the air.

## 2022-10-07 ENCOUNTER — Encounter (INDEPENDENT_AMBULATORY_CARE_PROVIDER_SITE_OTHER): Payer: Self-pay | Admitting: Ophthalmology

## 2022-10-09 ENCOUNTER — Ambulatory Visit (HOSPITAL_COMMUNITY): Payer: Self-pay

## 2022-10-11 ENCOUNTER — Ambulatory Visit
Admission: RE | Admit: 2022-10-11 | Discharge: 2022-10-11 | Disposition: A | Discharge status: Home / Self Care | Payer: 59 | Source: Ambulatory Visit | Attending: Family Medicine | Admitting: Family Medicine

## 2022-10-11 VITALS — BP 142/94 | HR 74 | Temp 98.0°F | Resp 20

## 2022-10-11 DIAGNOSIS — Z1152 Encounter for screening for COVID-19: Secondary | ICD-10-CM | POA: Insufficient documentation

## 2022-10-11 DIAGNOSIS — J45909 Unspecified asthma, uncomplicated: Secondary | ICD-10-CM | POA: Diagnosis present

## 2022-10-11 DIAGNOSIS — B9789 Other viral agents as the cause of diseases classified elsewhere: Secondary | ICD-10-CM | POA: Insufficient documentation

## 2022-10-11 DIAGNOSIS — J329 Chronic sinusitis, unspecified: Secondary | ICD-10-CM | POA: Diagnosis not present

## 2022-10-11 MED ORDER — DEXAMETHASONE SODIUM PHOSPHATE 10 MG/ML IJ SOLN
10.0000 mg | Freq: Once | INTRAMUSCULAR | Status: AC
  Administered 2022-10-11: 10 mg via INTRAMUSCULAR

## 2022-10-11 MED ORDER — FLUTICASONE PROPIONATE 50 MCG/ACT NA SUSP: 1.0000 | Freq: Two times a day (BID) | NASAL | 2 refills | Status: AC

## 2022-10-11 NOTE — ED Provider Notes (Signed)
[facility]    CSN: [id number] Arrival date & time: 10/11/22  0850      History   Chief Complaint Chief Complaint  Patient presents with   Cough    Head congestion,burning sinuses fever - Entered by patient    HPI Sean Knox is a 37 y.o. male.   Presenting today with 4 to 5-day history of right-sided facial pain and pressure, sinus burning sensation, fever, congestion, weakness, headache.  Denies chest pain, shortness of breath, abdominal pain, nausea vomiting or diarrhea.  Has been doing saline rinses, Sudafed and tried Afrin once but states the Afrin made his sinus passages burn like fire so he stopped it.  No known sick contacts recently.    Past Medical History:  Diagnosis Date   Asthma     There are no problems to display for this patient.   History reviewed. No pertinent surgical history.     Home Medications    Prior to Admission medications   Medication Sig Start Date End Date Taking? Authorizing Provider  escitalopram (LEXAPRO) 10 MG tablet Take 10 mg by mouth daily. 08/08/22  Yes [provider]  fluticasone (FLONASE) 50 MCG/ACT nasal spray Place 1 spray into both nostrils 2 (two) times daily. 10/11/22  Yes Particia Nearing, PA-C  lisdexamfetamine (VYVANSE) 40 MG capsule Take 40 mg by mouth daily. 08/29/22  Yes [provider]  albuterol (VENTOLIN HFA) 108 (90 Base) MCG/ACT inhaler Inhale 1-2 puffs into the lungs every 6 (six) hours as needed for wheezing or shortness of breath. 06/27/22   Valentino Nose, NP  amphetamine-dextroamphetamine (ADDERALL XR) 15 MG 24 hr capsule Take 15 mg by mouth every morning.    [provider]  lidocaine (XYLOCAINE) 2 % solution Use as directed 10 mLs in the mouth or throat every 3 (three) hours as needed for mouth pain. 02/10/22   Particia Nearing, PA-C    Family History Family History  Problem Relation Age of Onset   Diabetes Maternal Grandmother     Social  History Social History   Tobacco Use   Smoking status: Never    Passive exposure: Never   Smokeless tobacco: Never  Vaping Use   Vaping Use: Never used  Substance Use Topics   Alcohol use: Yes    Comment: social not much at all   Drug use: Never     Allergies   Patient has no known allergies.   Review of Systems Review of Systems PER HPI  Physical Exam Triage Vital Signs ED Triage Vitals  Enc Vitals Group     BP 10/11/22 0938 (!) 142/94     Pulse Rate 10/11/22 0938 74     Resp 10/11/22 0938 20     Temp 10/11/22 0938 98 F (36.7 C)     Temp Source 10/11/22 0938 Oral     SpO2 10/11/22 0938 98 %     Weight --      Height --      Head Circumference --      Peak Flow --      Pain Score 10/11/22 0939 7     Pain Loc --      Pain Edu? --      Excl. in [place]? --    No data found.  Updated Vital Signs BP (!) 142/94 (BP Location: Right Arm)   Pulse 74   Temp 98 F (36.7 C) (Oral)   Resp 20   SpO2 98%   Visual  Acuity Right Eye Distance:   Left Eye Distance:   Bilateral Distance:    Right Eye Near:   Left Eye Near:    Bilateral Near:     Physical Exam Vitals and nursing note reviewed.  Constitutional:      Appearance: He is well-developed.  HENT:     Head: Atraumatic.     Right Ear: Tympanic membrane and external ear normal.     Left Ear: Tympanic membrane and external ear normal.     Nose: Rhinorrhea present.     Mouth/Throat:     Pharynx: Posterior oropharyngeal erythema present. No oropharyngeal exudate.  Eyes:     Conjunctiva/sclera: Conjunctivae normal.     Pupils: Pupils are equal, round, and reactive to light.  Cardiovascular:     Rate and Rhythm: Normal rate and regular rhythm.     Heart sounds: Normal heart sounds.  Pulmonary:     Effort: Pulmonary effort is normal. No respiratory distress.     Breath sounds: Normal breath sounds. No wheezing or rales.  Musculoskeletal:        General: Normal range of motion.     Cervical back: Normal  range of motion and neck supple.  Lymphadenopathy:     Cervical: No cervical adenopathy.  Skin:    General: Skin is warm and dry.  Neurological:     Mental Status: He is alert and oriented to person, place, and time.  Psychiatric:        Behavior: Behavior normal.      UC Treatments / Results  Labs (all labs ordered are listed, but only abnormal results are displayed) Labs Reviewed  SARS CORONAVIRUS 2 (TAT 6-24 HRS)    EKG   Radiology No results found.  Procedures Procedures (including critical care time)  Medications Ordered in UC Medications  dexamethasone (DECADRON) injection 10 mg (10 mg Intramuscular Given 10/11/22 1014)    Initial Impression / Assessment and Plan / UC Course  I have reviewed the triage vital signs and the nursing notes.  Pertinent labs & imaging results that were available during my care of the patient were reviewed by me and considered in my medical decision making (see chart for details).     Consistent with viral sinusitis, COVID testing pending for rule out, IM Decadron, Flonase, over-the-counter decongestants and sinus rinses.  Return for worsening symptoms.  Work note given. Final Clinical Impressions(s) / UC Diagnoses   Final diagnoses:  Viral sinusitis   Discharge Instructions   None    ED Prescriptions     Medication Sig Dispense Auth. Provider   fluticasone (FLONASE) 50 MCG/ACT nasal spray Place 1 spray into both nostrils 2 (two) times daily. 16 g Volney American, [place]      PDMP not reviewed this encounter.   Volney American, [place] 10/11/22 1032

## 2022-10-11 NOTE — ED Triage Notes (Signed)
Pt reports right side of face is stopped up, sinus burns, fever, and nasal pressure. Took afrin but it made his burning worse. Has also tried saline rinse. Has trouble sleeping and sinus pain causes his teeth to hurt.

## 2022-10-12 LAB — SARS CORONAVIRUS 2 (TAT 6-24 HRS): SARS Coronavirus 2: NEGATIVE

## 2022-12-30 ENCOUNTER — Encounter (INDEPENDENT_AMBULATORY_CARE_PROVIDER_SITE_OTHER): Payer: Self-pay

## 2022-12-30 ENCOUNTER — Encounter (INDEPENDENT_AMBULATORY_CARE_PROVIDER_SITE_OTHER): Payer: 59 | Admitting: Ophthalmology

## 2022-12-30 DIAGNOSIS — H31002 Unspecified chorioretinal scars, left eye: Secondary | ICD-10-CM

## 2023-02-11 ENCOUNTER — Telehealth: Payer: 59 | Admitting: Nurse Practitioner

## 2023-02-11 DIAGNOSIS — J329 Chronic sinusitis, unspecified: Secondary | ICD-10-CM | POA: Diagnosis not present

## 2023-02-11 DIAGNOSIS — B9689 Other specified bacterial agents as the cause of diseases classified elsewhere: Secondary | ICD-10-CM | POA: Diagnosis not present

## 2023-02-11 MED ORDER — AMOXICILLIN-POT CLAVULANATE 875-125 MG PO TABS: 1.0000 | ORAL_TABLET | Freq: Two times a day (BID) | ORAL | 0 refills | Status: AC

## 2023-02-11 MED ORDER — AZELASTINE HCL 0.1 % NA SOLN: 1.0000 | Freq: Two times a day (BID) | NASAL | 0 refills | Status: AC

## 2023-02-11 NOTE — Progress Notes (Signed)
Virtual Visit Consent   Sean Knox, you are scheduled for a virtual visit with a Sean Knox provider today. Just as with appointments in the office, your consent must be obtained to participate. Your consent will be active for this visit and any virtual visit you may have with one of our providers in the next 365 days. If you have a MyChart account, a copy of this consent can be sent to you electronically.  As this is a virtual visit, video technology does not allow for your provider to perform a traditional examination. This may limit your provider's ability to fully assess your condition. If your provider identifies any concerns that need to be evaluated in person or the need to arrange testing (such as labs, EKG, etc.), we will make arrangements to do so. Although advances in technology are sophisticated, we cannot ensure that it will always work on either your end or our end. If the connection with a video visit is poor, the visit may have to be switched to a telephone visit. With either a video or telephone visit, we are not always able to ensure that we have a secure connection.  By engaging in this virtual visit, you consent to the provision of healthcare and authorize for your insurance to be billed (if applicable) for the services provided during this visit. Depending on your insurance coverage, you may receive a charge related to this service.  I need to obtain your verbal consent now. Are you willing to proceed with your visit today? Sean Knox has provided verbal consent on 02/11/2023 for a virtual visit (video or telephone). Sean Rigg, NP  Date: 02/11/2023 9:01 AM  Virtual Visit via Video Note   I, Sean Knox, connected with  Sean Knox  ([id number], [id number]) on 02/11/23 at  9:00 AM EDT by a video-enabled telemedicine application and verified that I am speaking with the correct person using two identifiers.  Location: Patient: Virtual Visit Location Patient:  Home Provider: Virtual Visit Location Provider: Home Office   I discussed the limitations of evaluation and management by telemedicine and the availability of in person appointments. The patient expressed understanding and agreed to proceed.    History of Present Illness: Sean Knox is a 37 y.o. who identifies as a male who was assigned male at birth, and is being seen today for bacterial sinusitis.  Sean Knox endorses 2 week onset of frontal and maxillary sinus pressure with purulent drainage, headache, low grade fever and cough. Symptoms persistent despite taking his allergy medication as prescribed.    Problems: There are no problems to display for this patient.   Allergies: No Known Allergies Medications:  Current Outpatient Medications:    amoxicillin-clavulanate (AUGMENTIN) 875-125 MG tablet, Take 1 tablet by mouth 2 (two) times daily for 7 days., Disp: 14 tablet, Rfl: 0   azelastine (ASTELIN) 0.1 % nasal spray, Place 1 spray into both nostrils 2 (two) times daily. Use in each nostril as directed, Disp: 30 mL, Rfl: 0   albuterol (VENTOLIN HFA) 108 (90 Base) MCG/ACT inhaler, Inhale 1-2 puffs into the lungs every 6 (six) hours as needed for wheezing or shortness of breath., Disp: 18 g, Rfl: 0   amphetamine-dextroamphetamine (ADDERALL XR) 15 MG 24 hr capsule, Take 15 mg by mouth every morning., Disp: , Rfl:    escitalopram (LEXAPRO) 10 MG tablet, Take 10 mg by mouth daily., Disp: , Rfl:    fluticasone (FLONASE) 50 MCG/ACT nasal spray, Place 1 spray into both nostrils  2 (two) times daily., Disp: 16 g, Rfl: 2   lidocaine (XYLOCAINE) 2 % solution, Use as directed 10 mLs in the mouth or throat every 3 (three) hours as needed for mouth pain., Disp: 100 mL, Rfl: 0   lisdexamfetamine (VYVANSE) 40 MG capsule, Take 40 mg by mouth daily., Disp: , Rfl:   Observations/Objective: Patient is well-developed, well-nourished in no acute distress.  Resting comfortably at home.  Head is  normocephalic, atraumatic.  No labored breathing.  Speech is clear and coherent with logical content.  Patient is alert and oriented at baseline.    Assessment and Plan: 1. Bacterial sinusitis - amoxicillin-clavulanate (AUGMENTIN) 875-125 MG tablet; Take 1 tablet by mouth 2 (two) times daily for 7 days.  Dispense: 14 tablet; Refill: 0 - azelastine (ASTELIN) 0.1 % nasal spray; Place 1 spray into both nostrils 2 (two) times daily. Use in each nostril as directed  Dispense: 30 mL; Refill: 0   Follow Up Instructions: I discussed the assessment and treatment plan with the patient. The patient was provided an opportunity to ask questions and all were answered. The patient agreed with the plan and demonstrated an understanding of the instructions.  A copy of instructions were sent to the patient via MyChart unless otherwise noted below.    The patient was advised to call back or seek an in-person evaluation if the symptoms worsen or if the condition fails to improve as anticipated.  Time:  I spent 12 minutes with the patient via telehealth technology discussing the above problems/concerns.    Sean Rigg, NP

## 2023-02-11 NOTE — Patient Instructions (Signed)
Sean Knox, thank you for joining Claiborne Rigg, NP for today's virtual visit.  While this provider is not your primary care provider (PCP), if your PCP is located in our provider database this encounter information will be shared with them immediately following your visit.   A [facility] MyChart account gives you access to today's visit and all your visits, tests, and labs performed at [facility] " click here if you don't have a [facility] MyChart account or go to [email or web address]  Consent: (Patient) [name] provided verbal consent for this virtual visit at the beginning of the encounter.  Current Medications:  Current Outpatient Medications:    amoxicillin-clavulanate (AUGMENTIN) 875-125 MG tablet, Take 1 tablet by mouth 2 (two) times daily for 7 days., Disp: 14 tablet, Rfl: 0   azelastine (ASTELIN) 0.1 % nasal spray, Place 1 spray into both nostrils 2 (two) times daily. Use in each nostril as directed, Disp: 30 mL, Rfl: 0   albuterol (VENTOLIN HFA) 108 (90 Base) MCG/ACT inhaler, Inhale 1-2 puffs into the lungs every 6 (six) hours as needed for wheezing or shortness of breath., Disp: 18 g, Rfl: 0   amphetamine-dextroamphetamine (ADDERALL XR) 15 MG 24 hr capsule, Take 15 mg by mouth every morning., Disp: , Rfl:    escitalopram (LEXAPRO) 10 MG tablet, Take 10 mg by mouth daily., Disp: , Rfl:    fluticasone (FLONASE) 50 MCG/ACT nasal spray, Place 1 spray into both nostrils 2 (two) times daily., Disp: 16 g, Rfl: 2   lidocaine (XYLOCAINE) 2 % solution, Use as directed 10 mLs in the mouth or throat every 3 (three) hours as needed for mouth pain., Disp: 100 mL, Rfl: 0   lisdexamfetamine (VYVANSE) 40 MG capsule, Take 40 mg by mouth daily., Disp: , Rfl:    Medications ordered in this encounter:  Meds ordered this encounter  Medications   amoxicillin-clavulanate (AUGMENTIN) 875-125 MG tablet    Sig: Take 1 tablet by mouth 2 (two) times daily for 7 days.     Dispense:  14 tablet    Refill:  0    Order Specific Question:   Supervising Provider    Answer:   Merrilee Jansky [[id number]]   azelastine (ASTELIN) 0.1 % nasal spray    Sig: Place 1 spray into both nostrils 2 (two) times daily. Use in each nostril as directed    Dispense:  30 mL    Refill:  0    Order Specific Question:   Supervising Provider    Answer:   Merrilee Jansky [[id number]]     *If you need refills on other medications prior to your next appointment, please contact your pharmacy*  Follow-Up: Call back or seek an in-person evaluation if the symptoms worsen or if the condition fails to improve as anticipated.  [facility] [phone]  Other Instructions Continue allergy medications as prescribed   If you have been instructed to have an in-person evaluation today at a local Urgent Care facility, please use the link below. It will take you to a list of all of our available [facility], including address, phone number and hours of operation. Please do not delay care.  [facility]  If you or a family member do not have a primary care provider, use the link below to schedule a visit and establish care. When you choose a [facility] primary care physician or advanced practice provider, you gain a long-term partner in health. Find  a Primary Care Provider  Learn more about [facility]'s in-office and virtual care options: [facility]
